# Patient Record
Sex: Female | Born: 1955 | Race: White | Hispanic: No | Smoking: Never smoker
Health system: Southern US, Community
[De-identification: ages and names within clinical notes are randomized; demographics above are authoritative.]

## PROBLEM LIST (undated history)

## (undated) DIAGNOSIS — E039 Hypothyroidism, unspecified: Secondary | ICD-10-CM

## (undated) DIAGNOSIS — I499 Cardiac arrhythmia, unspecified: Secondary | ICD-10-CM

## (undated) DIAGNOSIS — I779 Disorder of arteries and arterioles, unspecified: Secondary | ICD-10-CM

## (undated) DIAGNOSIS — I739 Peripheral vascular disease, unspecified: Secondary | ICD-10-CM

## (undated) DIAGNOSIS — F419 Anxiety disorder, unspecified: Secondary | ICD-10-CM

## (undated) DIAGNOSIS — G629 Polyneuropathy, unspecified: Secondary | ICD-10-CM

## (undated) DIAGNOSIS — C4490 Unspecified malignant neoplasm of skin, unspecified: Secondary | ICD-10-CM

## (undated) DIAGNOSIS — E538 Deficiency of other specified B group vitamins: Secondary | ICD-10-CM

## (undated) DIAGNOSIS — R002 Palpitations: Secondary | ICD-10-CM

## (undated) DIAGNOSIS — M797 Fibromyalgia: Secondary | ICD-10-CM

## (undated) DIAGNOSIS — K5792 Diverticulitis of intestine, part unspecified, without perforation or abscess without bleeding: Secondary | ICD-10-CM

## (undated) DIAGNOSIS — F329 Major depressive disorder, single episode, unspecified: Secondary | ICD-10-CM

## (undated) DIAGNOSIS — I10 Essential (primary) hypertension: Secondary | ICD-10-CM

## (undated) DIAGNOSIS — G43909 Migraine, unspecified, not intractable, without status migrainosus: Secondary | ICD-10-CM

## (undated) DIAGNOSIS — E7800 Pure hypercholesterolemia, unspecified: Secondary | ICD-10-CM

## (undated) DIAGNOSIS — R5383 Other fatigue: Secondary | ICD-10-CM

## (undated) DIAGNOSIS — F32A Depression, unspecified: Secondary | ICD-10-CM

## (undated) DIAGNOSIS — G500 Trigeminal neuralgia: Secondary | ICD-10-CM

## (undated) DIAGNOSIS — M2550 Pain in unspecified joint: Secondary | ICD-10-CM

## (undated) DIAGNOSIS — K589 Irritable bowel syndrome without diarrhea: Secondary | ICD-10-CM

## (undated) HISTORY — PX: OTHER SURGICAL HISTORY: SHX169

## (undated) HISTORY — DX: Anxiety disorder, unspecified: F41.9

## (undated) HISTORY — DX: Disorder of arteries and arterioles, unspecified: I77.9

## (undated) HISTORY — DX: Hypothyroidism, unspecified: E03.9

## (undated) HISTORY — PX: TOTAL ABDOMINAL HYSTERECTOMY W/ BILATERAL SALPINGOOPHORECTOMY: SHX83

## (undated) HISTORY — DX: Migraine, unspecified, not intractable, without status migrainosus: G43.909

## (undated) HISTORY — DX: Essential (primary) hypertension: I10

## (undated) HISTORY — DX: Trigeminal neuralgia: G50.0

## (undated) HISTORY — DX: Pain in unspecified joint: M25.50

## (undated) HISTORY — DX: Unspecified malignant neoplasm of skin, unspecified: C44.90

## (undated) HISTORY — DX: Peripheral vascular disease, unspecified: I73.9

## (undated) HISTORY — PX: TONSILLECTOMY: SUR1361

## (undated) HISTORY — DX: Cardiac arrhythmia, unspecified: I49.9

## (undated) HISTORY — PX: HEAD & NECK SKIN LESION EXCISIONAL BIOPSY: SUR472

## (undated) HISTORY — DX: Pure hypercholesterolemia, unspecified: E78.00

## (undated) HISTORY — DX: Irritable bowel syndrome, unspecified: K58.9

## (undated) HISTORY — DX: Depression, unspecified: F32.A

## (undated) HISTORY — DX: Palpitations: R00.2

## (undated) HISTORY — DX: Deficiency of other specified B group vitamins: E53.8

## (undated) HISTORY — DX: Diverticulitis of intestine, part unspecified, without perforation or abscess without bleeding: K57.92

## (undated) HISTORY — DX: Fibromyalgia: M79.7

## (undated) HISTORY — DX: Major depressive disorder, single episode, unspecified: F32.9

## (undated) HISTORY — DX: Polyneuropathy, unspecified: G62.9

## (undated) HISTORY — DX: Other fatigue: R53.83

## (undated) HISTORY — PX: LAPAROSCOPIC ENDOMETRIOSIS FULGURATION: SUR769

## (undated) HISTORY — PX: CHOLECYSTECTOMY: SHX55

---

## 2014-10-02 DIAGNOSIS — C4491 Basal cell carcinoma of skin, unspecified: Secondary | ICD-10-CM

## 2014-10-02 HISTORY — DX: Basal cell carcinoma of skin, unspecified: C44.91

## 2014-11-16 DIAGNOSIS — D229 Melanocytic nevi, unspecified: Secondary | ICD-10-CM

## 2014-11-16 HISTORY — DX: Melanocytic nevi, unspecified: D22.9

## 2017-11-11 DIAGNOSIS — C4492 Squamous cell carcinoma of skin, unspecified: Secondary | ICD-10-CM

## 2017-11-11 HISTORY — DX: Squamous cell carcinoma of skin, unspecified: C44.92

## 2018-07-26 ENCOUNTER — Encounter: Payer: Self-pay | Admitting: *Deleted

## 2018-07-27 ENCOUNTER — Ambulatory Visit (INDEPENDENT_AMBULATORY_CARE_PROVIDER_SITE_OTHER): Payer: Medicare Other | Admitting: Diagnostic Neuroimaging

## 2018-07-27 ENCOUNTER — Encounter: Payer: Self-pay | Admitting: Diagnostic Neuroimaging

## 2018-07-27 VITALS — BP 145/81 | HR 73 | Ht 65.0 in | Wt 201.0 lb

## 2018-07-27 DIAGNOSIS — R51 Headache: Secondary | ICD-10-CM | POA: Diagnosis not present

## 2018-07-27 DIAGNOSIS — E538 Deficiency of other specified B group vitamins: Secondary | ICD-10-CM | POA: Diagnosis not present

## 2018-07-27 DIAGNOSIS — G43109 Migraine with aura, not intractable, without status migrainosus: Secondary | ICD-10-CM

## 2018-07-27 DIAGNOSIS — G63 Polyneuropathy in diseases classified elsewhere: Secondary | ICD-10-CM

## 2018-07-27 DIAGNOSIS — R519 Headache, unspecified: Secondary | ICD-10-CM

## 2018-07-27 NOTE — Progress Notes (Signed)
[facility]  PATIENT: Stacie Leblanc DOB: 08/20/1955  REFERRING CLINICIAN: Brynda Greathouse HISTORY FROM: patient  REASON FOR VISIT: new consult    HISTORICAL  CHIEF COMPLAINT:  Chief Complaint  Patient presents with  . New Patient (Initial Visit)    Rm 7, alone  . Trigeminal Neuralgia    Has L eyebrow trig pain (4 yrs ago, then spring 2019) now constant L upper gum radiates to nose, eye, forehead burning pain. ? TP or migraine? Has visual flashes 1-2 wk/ 3-4x month L eye lid droops    HISTORY OF PRESENT ILLNESS:   63 year old female here for evaluation of left facial pain.  2015-2016 patient had brief ice pick like sensation of her left forehead.  Episode lasted for just a few seconds.  This happened a few times and spontaneously resolved.  March 2019 patient had some left upper /maxillary molar pain.  She went to the dentist and had a root canal.  Around this time she was having intermittent ice pick sensations over the left fore head region.  Over time she noticed a different kind of burning sensation and pain in her left nasalis region extending superiorly to the left medial orbit, and left supraorbital region.  Eating, brushing her teeth, brushing her skin with her hand would tend to trigger the sensations.  Symptoms are happening multiple times per day on a daily basis now.  Patient also has a history of "complex migraines".  These started when she was 76 or 63 years old.  She describes visual aura, speech arrest, numbness, word finding difficulties, followed by contralateral throbbing severe headaches associated with nausea, photophobia.  She may have 2 or 3 of these per month.  She may have visual auras on a daily basis.  She did well for many years with very rare migraine phenomenon.  In the last couple of years symptoms have worsened.  She has tried some medications for these migraines in the past but is not sure.  Patient also history of neuropathy, possibility  of B12 deficiency.  She is on B12 replacement.  Patient is now on Cymbalta for past 6 months for neuropathy pain as well as her left facial pain.  She is on Cymbalta 30 mg/day which has helped a little bit.  Patient went to eye doctor in the past year who noted left lower facial weakness.  He was concerned about some type of CNS process.  He recommended MRI of the brain but this was not ordered.  Patient has had multiple skin cancer surgeries on her face including her right nose, chin and the left mandibular regions.    REVIEW OF SYSTEMS: Full 14 system review of systems performed and negative with exception of: Chills blurred vision loss of vision feeling hot joint pain aching muscles too much sleep not asleep skin sensitivity ringing in ears palpitations swelling in legs fatigue.  ALLERGIES: No Known Allergies  HOME MEDICATIONS: Outpatient Medications Prior to Visit  Medication Sig Dispense Refill  . aspirin EC 81 MG tablet Take 81 mg by mouth daily.    Marland Kitchen atenolol (TENORMIN) 50 MG tablet Take 25 mg by mouth 2 (two) times daily.     . Cholecalciferol (VITAMIN D) 50 MCG (2000 UT) tablet Take 2,000 Units by mouth daily.    . clonazePAM (KLONOPIN) 2 MG tablet Take 2 mg by mouth 3 (three) times daily.    . cyanocobalamin (,VITAMIN B-12,) 1000 MCG/ML injection Inject 1,000 mcg into the muscle every 30 (thirty)  days.    Marland Kitchen DULoxetine (CYMBALTA) 30 MG capsule Take 30 mg by mouth daily.    . furosemide (LASIX) 20 MG tablet Take 20 mg by mouth as needed.    . hydrochlorothiazide (HYDRODIURIL) 25 MG tablet Take 25 mg by mouth daily.    . hydrocortisone 2.5 % cream Apply topically 3 (three) times daily.    Marland Kitchen levothyroxine (SYNTHROID, LEVOTHROID) 50 MCG tablet Take 50 mcg by mouth daily before breakfast.    . ketoconazole (NIZORAL) 2 % cream Apply 1 application topically daily as needed for irritation.     No facility-administered medications prior to visit.     PAST MEDICAL HISTORY: Past  Medical History:  Diagnosis Date  . Anxiety   . Arthralgia   . Carotid artery disease ([place])   . Depression   . Diverticulitis   . Dysrhythmia   . Fatigue   . Fibromyalgia   . Hypercholesterolemia   . Hypertension   . Hypothyroid   . Irritable bowel   . Migraines   . Neuropathy   . PAD (peripheral artery disease) ([place])   . Palpitations   . Skin cancer    basal and squamous cell, face and back  . Trigeminal neuralgia   . Vitamin B12 deficiency     PAST SURGICAL HISTORY: Past Surgical History:  Procedure Laterality Date  . cesarean secion     x 2 1980, 1981  . CHOLECYSTECTOMY    . HEAD & NECK SKIN LESION EXCISIONAL BIOPSY     nose, intranasal  . LAPAROSCOPIC ENDOMETRIOSIS FULGURATION    . TONSILLECTOMY    . TOTAL ABDOMINAL HYSTERECTOMY W/ BILATERAL SALPINGOOPHORECTOMY      FAMILY HISTORY: Family History  Problem Relation Age of Onset  . Breast cancer Mother   . High Cholesterol Mother   . Cancer Mother   . Breast cancer Maternal Aunt   . Multiple sclerosis Daughter        Sees Dr. Felecia Shelling  . Neuropathy Son     SOCIAL HISTORY: Social History   Socioeconomic History  . Marital status: Divorced    Spouse name: Not on file  . Number of children: Not on file  . Years of education: Not on file  . Highest education level: Not on file  Occupational History  . Not on file  Social Needs  . Financial resource strain: Not on file  . Food insecurity:    Worry: Not on file    Inability: Not on file  . Transportation needs:    Medical: Not on file    Non-medical: Not on file  Tobacco Use  . Smoking status: Not on file  Substance and Sexual Activity  . Alcohol use: Not on file  . Drug use: Not on file  . Sexual activity: Not on file  Lifestyle  . Physical activity:    Days per week: Not on file    Minutes per session: Not on file  . Stress: Not on file  Relationships  . Social connections:    Talks on phone: Not on file    Gets together: Not on file     Attends religious service: Not on file    Active member of club or organization: Not on file    Attends meetings of clubs or organizations: Not on file    Relationship status: Not on file  . Intimate partner violence:    Fear of current or ex partner: Not on file    Emotionally abused: Not on  file    Physically abused: Not on file    Forced sexual activity: Not on file  Other Topics Concern  . Not on file  Social History Narrative   Lives at home with children.  Education:  [employer].  Children 2.  Caffeine - a lot.     PHYSICAL EXAM  GENERAL EXAM/CONSTITUTIONAL: Vitals:  Vitals:   07/27/18 1449  BP: (!) 145/81  Pulse: 73  Weight: 201 lb (91.2 kg)  Height: 5\' 5"  (1.651 m)     Body mass index is 33.45 kg/m. Wt Readings from Last 3 Encounters:  07/27/18 201 lb (91.2 kg)     Patient is in no distress; well developed, nourished and groomed; neck is supple  CARDIOVASCULAR:  Examination of carotid arteries is normal; no carotid bruits  Regular rate and rhythm, no murmurs  Examination of peripheral vascular system by observation and palpation is normal  EYES:  Ophthalmoscopic exam of optic discs and posterior segments is normal; no papilledema or hemorrhages  Visual Acuity Screening   Right eye Left eye Both eyes  Without correction: 20/20 20/20   With correction:        MUSCULOSKELETAL:  Gait, strength, tone, movements noted in Neurologic exam below  NEUROLOGIC: MENTAL STATUS:  No flowsheet data found.  awake, alert, oriented to person, place and time  recent and remote memory intact  normal attention and concentration  language fluent, comprehension intact, naming intact  fund of knowledge appropriate  CRANIAL NERVE:   2nd - no papilledema on fundoscopic exam  2nd, 3rd, 4th, 6th - pupils equal and reactive to light, visual fields full to confrontation, extraocular muscles intact, no nystagmus  5th - facial sensation symmetric  7th - facial  strength --> DECR LEFT NL FOLD; INTERMITTENT LEFT ORBICULARIS OCULI SPASMS  8th - hearing intact  9th - palate elevates symmetrically, uvula midline  11th - shoulder shrug symmetric  12th - tongue protrusion midline  MOTOR:   normal bulk and tone, full strength in the BUE, BLE  SENSORY:   normal and symmetric to light touch, temperature, vibration  COORDINATION:   finger-nose-finger, fine finger movements normal  REFLEXES:   deep tendon reflexes --> BUE 2; BLE 1  GAIT/STATION:   narrow based gait     DIAGNOSTIC DATA (LABS, IMAGING, TESTING) - I reviewed patient records, labs, notes, testing and imaging myself where available.  No results found for: WBC, HGB, HCT, MCV, PLT No results found for: NA, K, CL, CO2, GLUCOSE, BUN, CREATININE, CALCIUM, PROT, ALBUMIN, AST, ALT, ALKPHOS, BILITOT, GFRNONAA, GFRAA No results found for: CHOL, HDL, LDLCALC, LDLDIRECT, TRIG, CHOLHDL No results found for: HGBA1C No results found for: VITAMINB12 No results found for: TSH      ASSESSMENT AND PLAN  63 y.o. year old female here with:  Dx:  1. Left facial pain   2. Vitamin B12 deficiency neuropathy ([place])   3. Complicated migraine     PLAN:  LEFT FACIAL PAIN (? Trigeminal neuralgia) - MRI brain and CN5 (with and without)  - consider increasing duloxetine to 60mg  daily  - may consider gabapentin or carbamazepine in future  LEFT FACIAL WEAKNESS / LEFT EYELID SPASM - check MRI  COMPLICATED MIGRAINE  - may consider topiramate for migraine prevention  NEUROPATHY (B12 DEFICIENCY) - continue B12 replacement  Orders Placed This Encounter  Procedures  . MR BRAIN W WO CONTRAST   Return in about 3 months (around 10/26/2018).    Penni Bombard, MD 07/27/2018, 3:30 PM  Certified in Neurology, Neurophysiology and [facility] [address] [place], [place] [address] [phone]

## 2018-07-28 ENCOUNTER — Telehealth: Payer: Self-pay | Admitting: Diagnostic Neuroimaging

## 2018-07-28 NOTE — Telephone Encounter (Signed)
Medicare order sent to GI. No auth they will reach out to the pt to schedule.  °

## 2018-07-28 NOTE — Telephone Encounter (Signed)
LVM for patient to be aware I left GI phone number of [phone] and to give them a call if she has not heard from them in the next 2-3 business days.

## 2018-08-06 ENCOUNTER — Ambulatory Visit
Admission: RE | Admit: 2018-08-06 | Discharge: 2018-08-06 | Disposition: A | Discharge status: Home / Self Care | Payer: Medicare Other | Source: Ambulatory Visit | Attending: Diagnostic Neuroimaging | Admitting: Diagnostic Neuroimaging

## 2018-08-06 DIAGNOSIS — R51 Headache: Principal | ICD-10-CM

## 2018-08-06 DIAGNOSIS — R519 Headache, unspecified: Secondary | ICD-10-CM

## 2018-08-06 MED ORDER — GADOBENATE DIMEGLUMINE 529 MG/ML IV SOLN
18.0000 mL | Freq: Once | INTRAVENOUS | Status: AC | PRN
  Administered 2018-08-06: 18 mL via INTRAVENOUS

## 2018-08-23 ENCOUNTER — Encounter: Payer: Self-pay | Admitting: Diagnostic Neuroimaging

## 2018-08-23 ENCOUNTER — Telehealth: Payer: Self-pay | Admitting: *Deleted

## 2018-08-23 NOTE — Telephone Encounter (Signed)
LVM informing patient her MRI brain result is unremarkable. Advised Dr Gladstone Lighter plan stated she may consider increasing Cymbalta dose, may consider other medications in future. Advised she call for any questions, concerns before her FU in April.

## 2018-09-15 NOTE — Telephone Encounter (Signed)
Pt's HA's have gotten worse over the past 2 weeks. Please call to advise at [phone]

## 2018-09-15 NOTE — Telephone Encounter (Signed)
LVM requesting call back to discuss further

## 2018-09-17 NOTE — Telephone Encounter (Signed)
Called patient to advise her Dr Leta Baptist has left for the day. I offered to send message to dr on call. She stated she'd rather wait until Monday to get Dr Penumalli's response. She stated it doesn't happen often, so she will go through weekend. She stated "I will be fine".  I advised her I will let her know on Monday. She verbalized understanding, appreciation.

## 2018-09-17 NOTE — Telephone Encounter (Addendum)
Called patient to discuss. She had 2 episodes of excruciating pain in cheek and tooth area.  She stated she's having deeper sharper pain into her cheek that now that feels like a screw driver. It feels like its behind her eye, and her lip gets numb.  She increased cymbalta to 60 mg daily in Jan after office visit. As far as migraines she reported having visual auras almost daily.  She is interested in a migraine preventative medication. She then mentioned that herPCP recommends sleep study for chronic fatigue, having to nap in the afternoon, wakes exhausted some mornings.  I advised her as to sleep study, she can have her PCP send referral to our office to see one of our sleep specialists. As to other complaints, concerns I advised will discuss with Dr Leta Baptist and let her know today. Patient verbalized understanding, appreciation.

## 2018-10-20 ENCOUNTER — Telehealth: Payer: Self-pay | Admitting: *Deleted

## 2018-10-20 NOTE — Telephone Encounter (Signed)
Called patient and informed her that due to current COVID 19 pandemic, our office is severely reducing in person visits in order to minimize the risk to our patients and healthcare providers. We recommend to convert your appointment to a video visit. She stated she will have to see if she can borrow a device to do a video. She will call back later today. She  verbalized understanding, appreciation.

## 2018-10-25 NOTE — Telephone Encounter (Signed)
LVM requesting call back to discuss video vs tele visit.

## 2018-10-25 NOTE — Telephone Encounter (Signed)
Called patient and advised her that due to current COVID 19 pandemic, our office is severely reducing in person visits in order to minimize the risk to our patients and healthcare providers. The phone went dead. I call her back, LVM advising her that we can convert to video or telephone visit. I advised her the appt will be cancelled if she does not return call in 1 hour, so that we can schedule another patient. I informed her that Dr Leta Baptist has openings later this week, so she can be rescheduled.

## 2018-10-25 NOTE — Telephone Encounter (Signed)
Patient called back, stated previous call was dropped. She would like to do video visit but on Wed afternoon. Appt rescheduled. Advised her we'll take all precautions to reduce any security or privacy concerns. This will be treated like an office visit, and we will file with your insurance.  She consented to video visit.  Pt's email is [email or web address]. Pt understands that the [employer] webex software must be downloaded and operational on the device pt plans to use for the visit. Updated EMR. She had no questions,  verbalized understanding, appreciation. Webex scheduled; e mail sent.

## 2018-10-26 ENCOUNTER — Ambulatory Visit: Payer: Medicare Other | Admitting: Diagnostic Neuroimaging

## 2018-10-27 ENCOUNTER — Encounter: Payer: Self-pay | Admitting: Diagnostic Neuroimaging

## 2018-10-27 ENCOUNTER — Other Ambulatory Visit: Payer: Self-pay

## 2018-10-27 ENCOUNTER — Ambulatory Visit (INDEPENDENT_AMBULATORY_CARE_PROVIDER_SITE_OTHER): Payer: Medicare Other | Admitting: Diagnostic Neuroimaging

## 2018-10-27 DIAGNOSIS — G43109 Migraine with aura, not intractable, without status migrainosus: Secondary | ICD-10-CM

## 2018-10-27 DIAGNOSIS — R51 Headache: Secondary | ICD-10-CM | POA: Diagnosis not present

## 2018-10-27 DIAGNOSIS — R519 Headache, unspecified: Secondary | ICD-10-CM

## 2018-10-27 MED ORDER — TOPIRAMATE 50 MG PO TABS: 50.0000 mg | ORAL_TABLET | Freq: Two times a day (BID) | ORAL | 12 refills | Status: AC

## 2018-10-27 NOTE — Progress Notes (Signed)
[facility]  PATIENT: Stacie Leblanc DOB: [birth year]  REFERRING CLINICIAN: [place] HISTORY FROM: patient  REASON FOR VISIT: follow up    HISTORICAL  CHIEF COMPLAINT:  Chief Complaint  Patient presents with  . Facial Pain  . Headache    HISTORY OF PRESENT ILLNESS:   UPDATE (10/27/18, VRP): Since last visit, doing well. Symptoms are stable. Severity is mild. No alleviating or aggravating factors. Tolerating meds.    PRIOR HPI (07/27/18): 63 year old female here for evaluation of left facial pain.  2015-2016 patient had brief ice pick like sensation of her left forehead.  Episode lasted for just a few seconds.  This happened a few times and spontaneously resolved.  March 2019 patient had some left upper /maxillary molar pain.  She went to the dentist and had a root canal.  Around this time she was having intermittent ice pick sensations over the left fore head region.  Over time she noticed a different kind of burning sensation and pain in her left nasalis region extending superiorly to the left medial orbit, and left supraorbital region.  Eating, brushing her teeth, brushing her skin with her hand would tend to trigger the sensations.  Symptoms are happening multiple times per day on a daily basis now.  Patient also has a history of "complex migraines".  These started when she was 37 or 63 years old.  She describes visual aura, speech arrest, numbness, word finding difficulties, followed by contralateral throbbing severe headaches associated with nausea, photophobia.  She may have 2 or 3 of these per month.  She may have visual auras on a daily basis.  She did well for many years with very rare migraine phenomenon.  In the last couple of years symptoms have worsened.  She has tried some medications for these migraines in the past but is not sure.  Patient also history of neuropathy, possibility of B12 deficiency.  She is on B12 replacement.  Patient is now on  Cymbalta for past 6 months for neuropathy pain as well as her left facial pain.  She is on Cymbalta 30 mg/day which has helped a little bit.  Patient went to eye doctor in the past year who noted left lower facial weakness.  He was concerned about some type of CNS process.  He recommended MRI of the brain but this was not ordered.  Patient has had multiple skin cancer surgeries on her face including her right nose, chin and the left mandibular regions.    REVIEW OF SYSTEMS: Full 14 system review of systems performed and negative with exception of: only as per HPI.    ALLERGIES: Allergies  Allergen Reactions  . Codeine Shortness Of Breath    Trouble breathing  . Gadolinium Derivatives     hives  . Alpha-Gal Hives  . Amoxicillin Hives  . Azithromycin Hives  . Ceclor [Cefaclor] Hives  . Influenza Vaccines Hives  . Morphine And Related Hives  . Sulfa Antibiotics Hives  . Multihance [Gadobenate] Hives    Pt monitored by Nursing. Advise steroid prep for further MRI    HOME MEDICATIONS: Outpatient Medications Prior to Visit  Medication Sig Dispense Refill  . aspirin EC 81 MG tablet Take 81 mg by mouth daily.    Marland Kitchen atenolol (TENORMIN) 50 MG tablet Take 25 mg by mouth 2 (two) times daily.     . Cholecalciferol (VITAMIN D) 50 MCG (2000 UT) tablet Take 2,000 Units by mouth daily.    . clonazePAM (KLONOPIN) 2  MG tablet Take 2 mg by mouth 3 (three) times daily.    . cyanocobalamin (,VITAMIN B-12,) 1000 MCG/ML injection Inject 1,000 mcg into the muscle every 30 (thirty) days.    . DULoxetine (CYMBALTA) 30 MG capsule Take 30 mg by mouth daily.    . furosemide (LASIX) 20 MG tablet Take 20 mg by mouth as needed.    . hydrochlorothiazide (HYDRODIURIL) 25 MG tablet Take 25 mg by mouth daily.    . hydrocortisone 2.5 % cream Apply topically 3 (three) times daily.    Marland Kitchen levothyroxine (SYNTHROID, LEVOTHROID) 50 MCG tablet Take 50 mcg by mouth daily before breakfast.    . montelukast (SINGULAIR) 10  MG tablet Take 10 mg by mouth daily.     No facility-administered medications prior to visit.     PAST MEDICAL HISTORY: Past Medical History:  Diagnosis Date  . Anxiety   . Arthralgia   . Carotid artery disease ([place])   . Depression   . Diverticulitis   . Dysrhythmia   . Fatigue   . Fibromyalgia   . Hypercholesterolemia   . Hypertension   . Hypothyroid   . Irritable bowel   . Migraines   . Neuropathy   . PAD (peripheral artery disease) ([place])   . Palpitations   . Skin cancer    basal and squamous cell, face and back  . Trigeminal neuralgia   . Vitamin B12 deficiency     PAST SURGICAL HISTORY: Past Surgical History:  Procedure Laterality Date  . cesarean secion     x 2 1980, 1981  . CHOLECYSTECTOMY    . HEAD & NECK SKIN LESION EXCISIONAL BIOPSY     nose, intranasal  . LAPAROSCOPIC ENDOMETRIOSIS FULGURATION    . TONSILLECTOMY    . TOTAL ABDOMINAL HYSTERECTOMY W/ BILATERAL SALPINGOOPHORECTOMY      FAMILY HISTORY: Family History  Problem Relation Age of Onset  . Breast cancer Mother   . High Cholesterol Mother   . Cancer Mother   . Breast cancer Maternal Aunt   . Multiple sclerosis Daughter        Sees Dr. Felecia Shelling  . Neuropathy Son     SOCIAL HISTORY: Social History   Socioeconomic History  . Marital status: Divorced    Spouse name: Not on file  . Number of children: Not on file  . Years of education: Not on file  . Highest education level: Not on file  Occupational History  . Not on file  Social Needs  . Financial resource strain: Not on file  . Food insecurity:    Worry: Not on file    Inability: Not on file  . Transportation needs:    Medical: Not on file    Non-medical: Not on file  Tobacco Use  . Smoking status: Never Smoker  . Smokeless tobacco: Never Used  Substance and Sexual Activity  . Alcohol use: Not Currently    Frequency: Never  . Drug use: Never  . Sexual activity: Not on file  Lifestyle  . Physical activity:    Days per week:  Not on file    Minutes per session: Not on file  . Stress: Not on file  Relationships  . Social connections:    Talks on phone: Not on file    Gets together: Not on file    Attends religious service: Not on file    Active member of club or organization: Not on file    Attends meetings of clubs or organizations: Not  on file    Relationship status: Not on file  . Intimate partner violence:    Fear of current or ex partner: Not on file    Emotionally abused: Not on file    Physically abused: Not on file    Forced sexual activity: Not on file  Other Topics Concern  . Not on file  Social History Narrative   Lives at home with children.  Education:  [employer].  Children 2.  Caffeine - a lot.     PHYSICAL EXAM  GENERAL EXAM/CONSTITUTIONAL: Vitals:  There were no vitals filed for this visit. There is no height or weight on file to calculate BMI. Wt Readings from Last 3 Encounters:  07/27/18 201 lb (91.2 kg)    Patient is in no distress; well developed, nourished and groomed; neck is supple  NEUROLOGIC: MENTAL STATUS:  No flowsheet data found.  awake, alert, oriented to person, place and time  recent and remote memory intact  normal attention and concentration  language fluent, comprehension intact, naming intact  fund of knowledge appropriate  CRANIAL NERVE:   2nd, 3rd, 4th, 6th - visual fields full to confrontation, extraocular muscles intact, no nystagmus  5th - facial sensation --> SLIGHTLY REDUCED ON LEFT FACE  7th - facial strength --> MILD DECR LEFT NL FOLD  8th - hearing intact  11th - shoulder shrug symmetric  12th - tongue protrusion midline  MOTOR:   NO DRIFT; NO TREMOR  COORDINATION:   fine finger movements normal     DIAGNOSTIC DATA (LABS, IMAGING, TESTING) - I reviewed patient records, labs, notes, testing and imaging myself where available.  No results found for: WBC, HGB, HCT, MCV, PLT No results found for: NA, K, CL, CO2, GLUCOSE,  BUN, CREATININE, CALCIUM, PROT, ALBUMIN, AST, ALT, ALKPHOS, BILITOT, GFRNONAA, GFRAA No results found for: CHOL, HDL, LDLCALC, LDLDIRECT, TRIG, CHOLHDL No results found for: HGBA1C No results found for: VITAMINB12 No results found for: TSH   08/06/18 MRI brain and IAC (with and without) demonstrating: - Few scattered periventricular and subcortical foci of non-specific gliosis. No abnormal lesions are seen on post contrast views. - No acute findings. - Patient had allergic reaction (hives) to IV gadolinium contrast.    ASSESSMENT AND PLAN  63 y.o. year old female here with:  Dx:  1. Left facial pain   2. Complicated migraine     Virtual Visit via Video Note  I connected with AMORIA MCLEES on 10/27/18 at  3:00 PM EDT by a video enabled telemedicine application and verified that I am speaking with the correct person using two identifiers.   I discussed the limitations of evaluation and management by telemedicine and the availability of in person appointments. The patient expressed understanding and agreed to proceed.  I discussed the assessment and treatment plan with the patient. The patient was provided an opportunity to ask questions and all were answered. The patient agreed with the plan and demonstrated an understanding of the instructions.   The patient was advised to call back or seek an in-person evaluation if the symptoms worsen or if the condition fails to improve as anticipated.  I provided 15 minutes of non-face-to-face time during this encounter.   PLAN:  LEFT FACIAL PAIN (? Trigeminal neuralgia) - MONITOR SYMPTOMS FOR NOW - in future consider increasing duloxetine to 60mg  daily  - in future consider gabapentin or carbamazepine in future  COMPLICATED MIGRAINE  - start topiramate for migraine prevention  NEUROPATHY (B12 DEFICIENCY) - continue  B12 replacement  Meds ordered this encounter  Medications  . topiramate (TOPAMAX) 50 MG tablet    Sig: Take 1  tablet (50 mg total) by mouth 2 (two) times daily.    Dispense:  60 tablet    Refill:  12   Return in about 4 months (around 02/26/2019).    Penni Bombard, MD 1/58/0638, 6:85 PM Certified in Neurology, Neurophysiology and Neuroimaging  [facility] [address] [place], [place] [address] [phone]

## 2019-03-18 IMAGING — MR MR HEAD WO/W CM
17 series · 47 of 48 positions shown · IV contrast (20ml multihance)
Comparison: none

[Series 2: T1 · sagittal · 5.0mm · 0.45mm/px · 1 of 24 slices shown (1 of 3)]
[im 1/24]
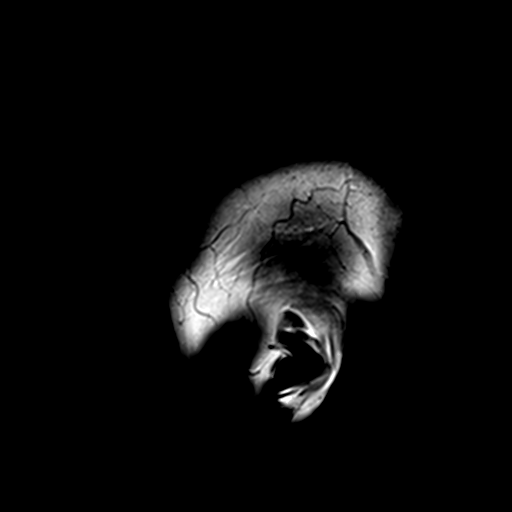

[Series 3: DWI · axial · 3.0mm · 1.80mm/px · z∈[-68,+94]mm · 4 of 110 slices shown (1 of 4)]
[im 1/110]
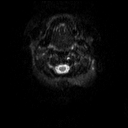
[im 37/110]
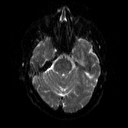
[im 73/110]
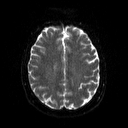
[im 110/110]
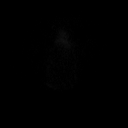

[Series 4: DWI · axial · 3.0mm · 1.80mm/px · z∈[-68,+94]mm · 2 of 53 slices shown (2 of 4)]
[im 1/53]
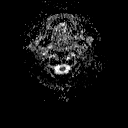
[im 53/53]
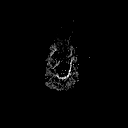

[Series 5: DWI · coronal · 5.0mm · 1.88mm/px · 4 of 85 slices shown (3 of 4)]
[im 1/85]
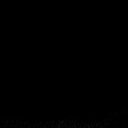
[im 29/85]
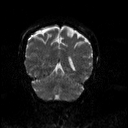
[im 57/85]
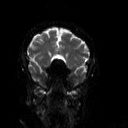
[im 85/85]
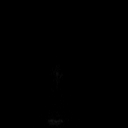

[Series 6: DWI · coronal · 5.0mm · 1.88mm/px · 2 of 43 slices shown (4 of 4)]
[im 1/43]
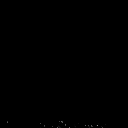
[im 43/43]
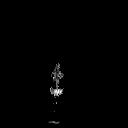

[Series 7: T2 · axial · 5.0mm · 0.54mm/px · 1 of 22 slices shown (1 of 2)]
[im 1/22]
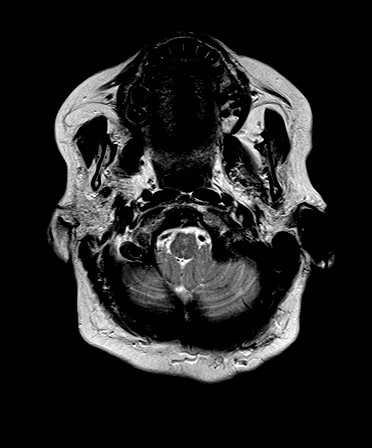

[Series 8: FLAIR · axial · 3.0mm · 0.47mm/px · 1 of 17 slices shown]
[im 1/17]
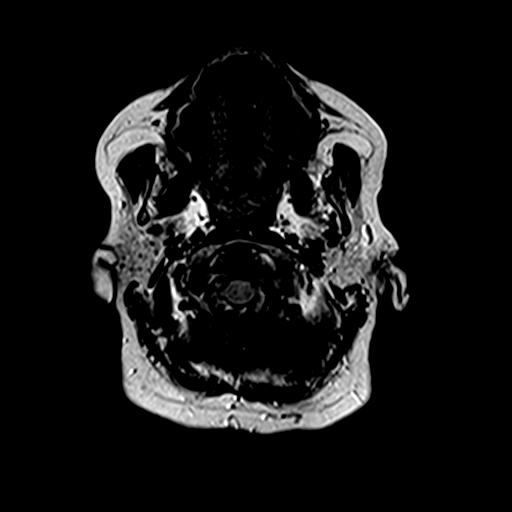

[Series 10: swi_images · axial · 4.0mm · 0.94mm/px · z∈[-60,+96]mm · 2 of 40 slices shown]
[im 1/40]
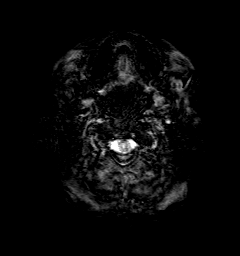
[im 40/40]
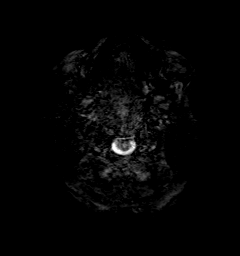

[Series 11: t1_mpr_tra · axial · 1.1mm · 0.75mm/px · z∈[-66,+92]mm · 7 of 144 slices shown (1 of 2)]
[im 1/144]
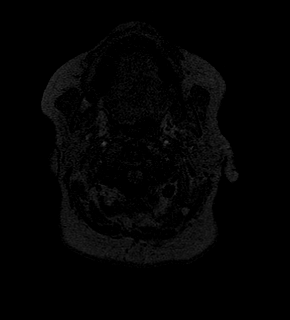
[im 24/144]
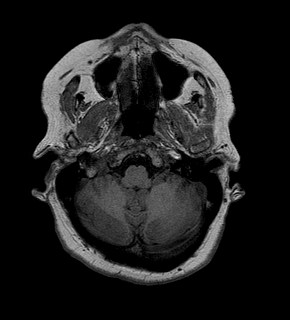
[im 48/144]
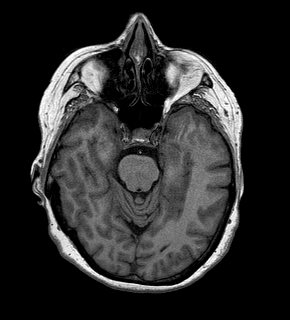
[im 72/144]
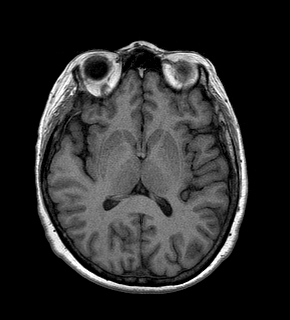
[im 96/144]
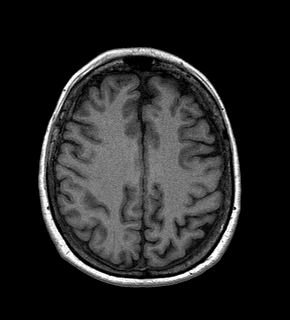
[im 120/144]
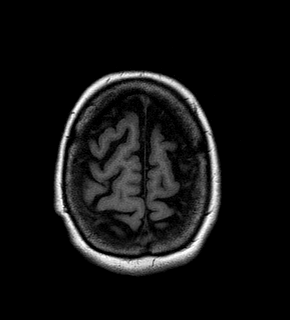
[im 144/144]
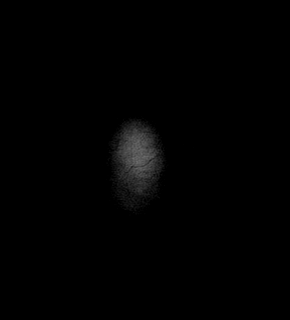

[Series 12: STIR · axial · 3.0mm · 0.62mm/px · z∈[-108,+35]mm · 2 of 37 slices shown]
[im 1/37]
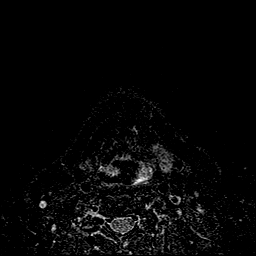
[im 37/37]
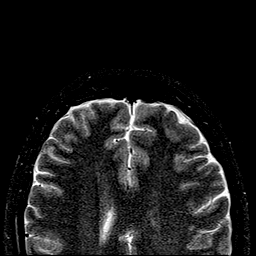

[Series 13: bSSFP · axial · 1.0mm · 0.50mm/px · z∈[-101,+18]mm · 5 of 120 slices shown]
[im 1/120]
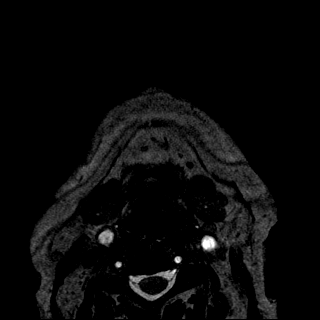
[im 30/120]
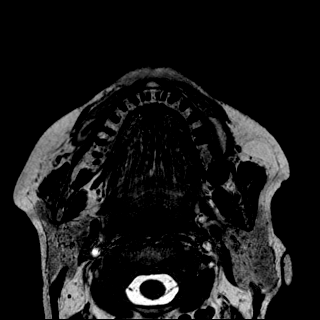
[im 60/120]
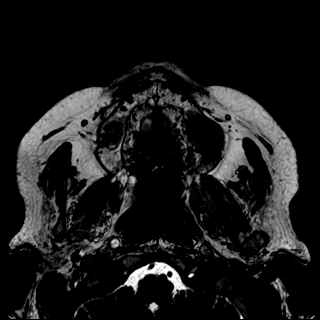
[im 90/120]
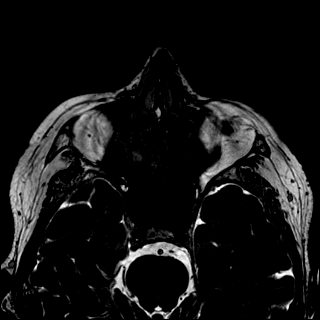
[im 120/120]
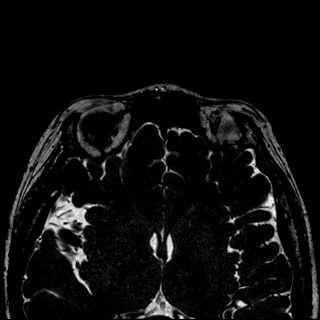

[Series 14: T1 · coronal · 3.0mm · 0.35mm/px · 2 of 43 slices shown (2 of 3)]
[im 1/43]
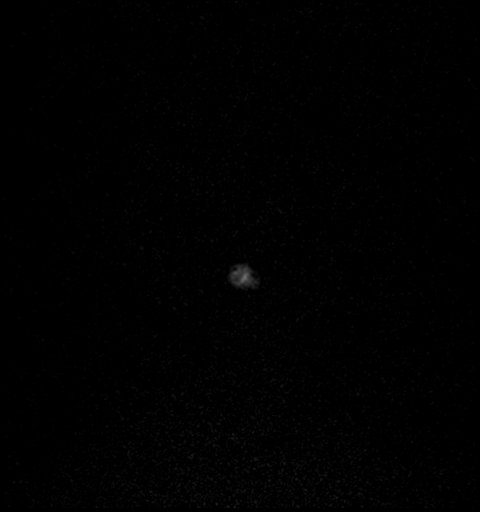
[im 43/43]
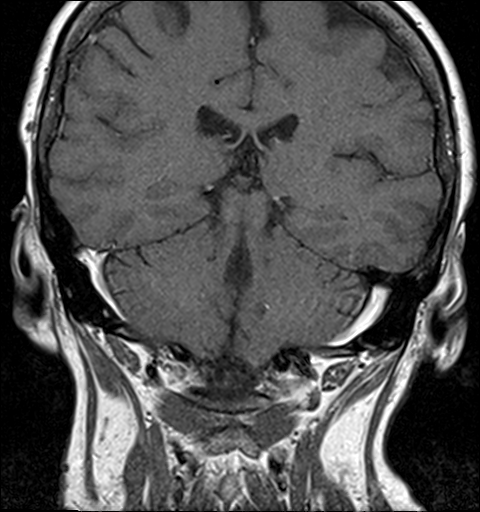

[Series 15: T1 · axial · 3.0mm · 0.50mm/px · z∈[-108,+37]mm · 2 of 45 slices shown (3 of 3)]
[im 1/45]
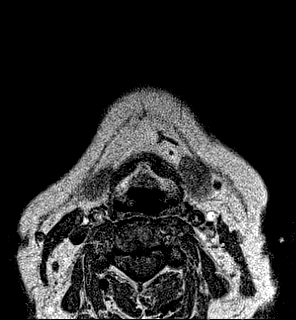
[im 45/45]
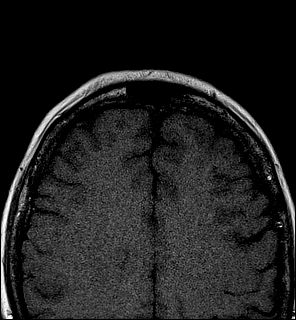

[Series 16: T2 · coronal · 5.0mm · 0.45mm/px · 2 of 34 slices shown (2 of 2)]
[im 1/34]
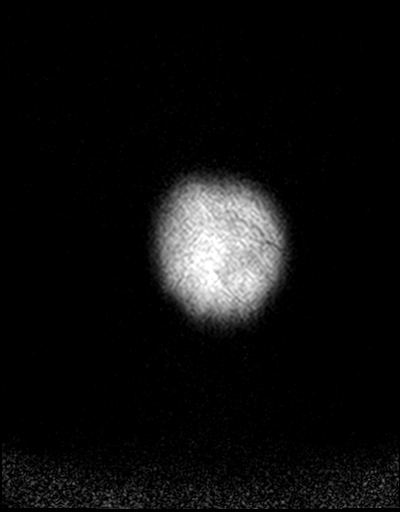
[im 34/34]
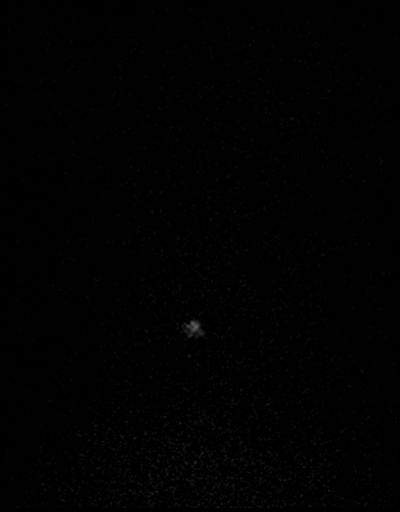

[Series 17: T1 fat-sat · coronal · 3.0mm · 0.35mm/px · 2 of 43 slices shown (1 of 2)]
[im 1/43]
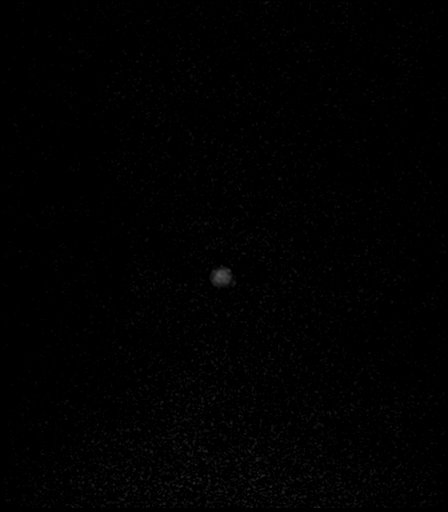
[im 43/43]
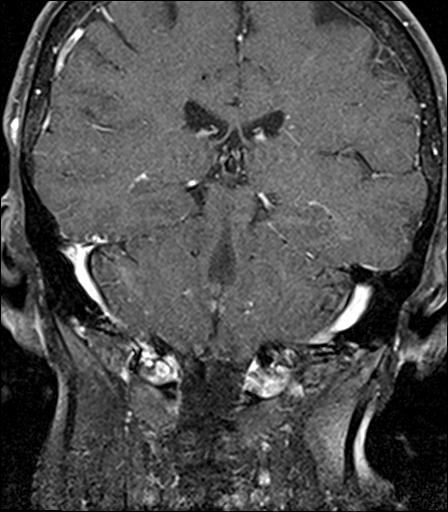

[Series 18: T1 fat-sat · axial · 3.0mm · 0.50mm/px · z∈[-107,+38]mm · 2 of 45 slices shown (2 of 2)]
[im 1/45]
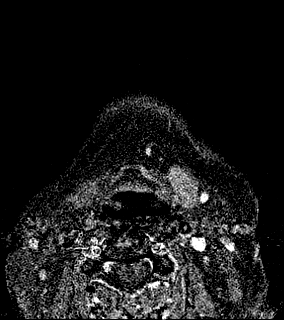
[im 45/45]
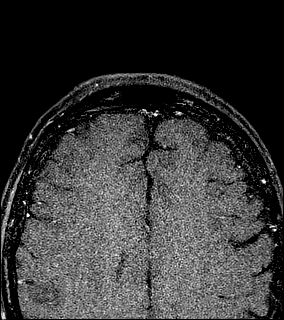

[Series 19: t1_mpr_tra · axial · 1.1mm · 0.75mm/px · z∈[-61,+70]mm · 6 of 144 slices shown (2 of 2)]
[im 1/144]
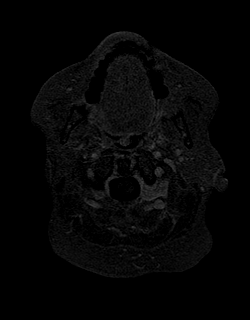
[im 24/144]
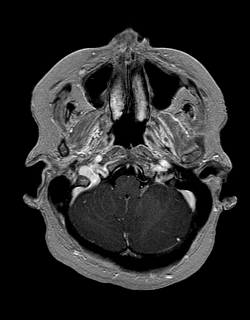
[im 48/144]
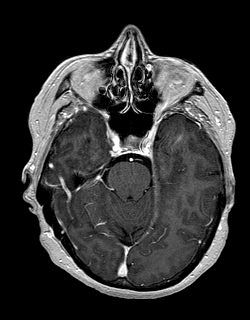
[im 72/144]
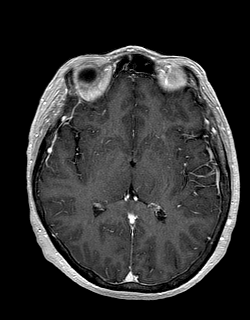
[im 96/144]
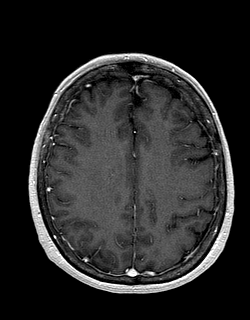
[im 120/144]
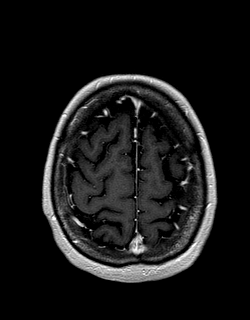

[47 of 48 positions shown; findings below may reference images not displayed]

:
CONTRAST REACTION NOTE

Please note that this exam's formal interpretation is to be provided
by [REDACTED]. I did not review the patient's
images.

The patient experienced hives after injection of 20 cc MultiHance
and I was called to assess her. She was in no distress. Pulse rate
was in the 60s, blood pressure somewhat elevated originally about
175/92, then decreasing to about systolic 160s and diastolic 70s
range. Her breathing was normal with lungs clear to auscultation
bilaterally. No wheezing. No abnormal upper airway sounds. She had
no complaints related to breathing.

She did have a hive and some erythema along the right side of her
neck at original evaluation.

I checked on Ms. Chui King several times over a 40 minutes observation
window. After 40 minutes, the hive had nearly completely resolved
and the surrounding erythema was gone. She had no clinical signs of
worsening or a severe contrast reaction.

Normally in this situation I would have preferred to administer 50
mg of Benadryl, but in this case the patient is from [place]], and did not have someone who could drive her back. Accordingly,
because of the potential for drowsiness with Benadryl and it is
potential effect on driving, I felt that it was safer to not
administer the Benadryl.

I discharged the from the outpatient center with instructions that
in the unlikely event that her symptoms were to recur or were she to
have other more significant symptoms such as shortness of breath,
wheezing, dizziness, or other symptoms, that she should immediately
pull over her vehicle in a safe place and call 911 for assistance,
or if at home, to call for emergency services.

My overall assessment is that she had a mild contrast reaction
including hives and possibly hypertension. Should she need MRI
contrast in the future, premedication would be advised.

## 2019-11-01 ENCOUNTER — Other Ambulatory Visit: Payer: Self-pay | Admitting: Diagnostic Neuroimaging

## 2019-11-01 NOTE — Telephone Encounter (Signed)
Pt is overdue for an appt. I called pt to schedule this appt. No answer, left a message asking her to call me back. If pt calls back please schedule this appt.

## 2019-12-20 ENCOUNTER — Encounter: Payer: Self-pay | Admitting: *Deleted

## 2019-12-22 ENCOUNTER — Ambulatory Visit (INDEPENDENT_AMBULATORY_CARE_PROVIDER_SITE_OTHER): Payer: Medicare Other | Admitting: Physician Assistant

## 2019-12-22 ENCOUNTER — Other Ambulatory Visit: Payer: Self-pay

## 2019-12-22 ENCOUNTER — Encounter: Payer: Self-pay | Admitting: Physician Assistant

## 2019-12-22 DIAGNOSIS — Z87898 Personal history of other specified conditions: Secondary | ICD-10-CM

## 2019-12-22 DIAGNOSIS — Z85828 Personal history of other malignant neoplasm of skin: Secondary | ICD-10-CM | POA: Diagnosis not present

## 2019-12-22 DIAGNOSIS — L719 Rosacea, unspecified: Secondary | ICD-10-CM

## 2019-12-22 DIAGNOSIS — L570 Actinic keratosis: Secondary | ICD-10-CM | POA: Diagnosis not present

## 2019-12-22 DIAGNOSIS — Z86018 Personal history of other benign neoplasm: Secondary | ICD-10-CM

## 2019-12-22 DIAGNOSIS — D485 Neoplasm of uncertain behavior of skin: Secondary | ICD-10-CM

## 2019-12-22 MED ORDER — METRONIDAZOLE 0.75 % EX CREA: TOPICAL_CREAM | Freq: Two times a day (BID) | CUTANEOUS | 0 refills | Status: AC

## 2019-12-22 NOTE — Progress Notes (Signed)
   Follow up Visit  Subjective  Stacie Leblanc is a 64 y.o. female who presents for the following: Skin Problem (Several spots on forehead x 6 months, no bleeding) and Actinic Keratosis (spot on left nose was Ln2 in 05/24/2019 and is still there ). Bump on left forehead that has been there 6 months. It itches but is not sore and hasnt bled. She also has a place on the left bridge of the nose that was frozen last fall by ST but has come back. It isn't sore and hasn't bled. 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 History of BCC on left back and nose. Some were done here and some were done in [place] [facility].   Objective  Well appearing patient in no apparent distress; mood and affect are within normal limits.  A focused examination was performed including face, neck, chest,arms and back. Relevant physical exam findings are noted in the Assessment and Plan. No suspicious moles noted on back.   Objective  Left Nasal Sidewall: Scaling papule     Objective  Left Forehead: Keratotic papule     Objective  Left Forehead, Left Temporal Scalp: Erythematous patches with gritty scale.  Objective  Left Malar Cheek, Right Malar Cheek: Centrifacial erythema with or without papules/pustules.   Assessment & Plan  History of atypical nevus Neck - Posterior  Personal history of squamous cell carcinoma of skin (2) Left Anterior Mandible; Mid Chin  History of basal cell carcinoma (BCC) (2) Left Upper Back; Dorsum of Nose  Neoplasm of uncertain behavior of skin (2) Left Nasal Sidewall  Skin / nail biopsy Type of biopsy: tangential   Informed consent: discussed and consent obtained   Timeout: patient name, date of birth, surgical site, and procedure verified   Anesthesia: the lesion was anesthetized in a standard fashion   Anesthetic:  1% lidocaine w/ epinephrine 1-100,000 local infiltration Instrument used: flexible razor blade   Hemostasis achieved with: ferric subsulfate   Outcome:  patient tolerated procedure well   Post-procedure details: wound care instructions given    Specimen 1 - Surgical pathology Differential Diagnosis: scc vs bcc Check Margins: No  Left Forehead  Skin / nail biopsy Type of biopsy: tangential   Informed consent: discussed and consent obtained   Timeout: patient name, date of birth, surgical site, and procedure verified   Anesthesia: the lesion was anesthetized in a standard fashion   Anesthetic:  1% lidocaine w/ epinephrine 1-100,000 local infiltration Instrument used: flexible razor blade   Hemostasis achieved with: ferric subsulfate   Outcome: patient tolerated procedure well   Post-procedure details: wound care instructions given    Specimen 2 - Surgical pathology Differential Diagnosis: scc vs bcc Check Margins: No  AK (actinic keratosis) (2) Left Forehead; Left Temporal Scalp  Destruction of lesion - Left Forehead, Left Temporal Scalp Complexity: simple   Destruction method: cryotherapy   Informed consent: discussed and consent obtained   Lesion destroyed using liquid nitrogen: Yes   Outcome: patient tolerated procedure well with no complications   Post-procedure details: wound care instructions given    Rosacea (2) Left Malar Cheek; Right Malar Cheek  Ordered Medications: metroNIDAZOLE (METROCREAM) 0.75 % cream

## 2019-12-27 ENCOUNTER — Telehealth: Payer: Self-pay | Admitting: *Deleted

## 2019-12-27 NOTE — Telephone Encounter (Signed)
Pathology to patient: 2 month recheck office visit scheduled.

## 2019-12-27 NOTE — Telephone Encounter (Signed)
-----   Message from Arlyss Gandy, [place] sent at 12/26/2019  5:31 PM EDT ----- Precancer. Recheck with me in 2 months

## 2020-03-05 ENCOUNTER — Ambulatory Visit: Payer: Medicare Other | Admitting: Physician Assistant

## 2020-03-29 ENCOUNTER — Encounter: Payer: Self-pay | Admitting: Physician Assistant

## 2020-03-29 ENCOUNTER — Other Ambulatory Visit: Payer: Self-pay

## 2020-03-29 ENCOUNTER — Ambulatory Visit (INDEPENDENT_AMBULATORY_CARE_PROVIDER_SITE_OTHER): Payer: Medicare Other | Admitting: Physician Assistant

## 2020-03-29 DIAGNOSIS — L219 Seborrheic dermatitis, unspecified: Secondary | ICD-10-CM | POA: Diagnosis not present

## 2020-03-29 DIAGNOSIS — D485 Neoplasm of uncertain behavior of skin: Secondary | ICD-10-CM | POA: Diagnosis not present

## 2020-03-29 DIAGNOSIS — Z85828 Personal history of other malignant neoplasm of skin: Secondary | ICD-10-CM | POA: Diagnosis not present

## 2020-03-29 DIAGNOSIS — Z8589 Personal history of malignant neoplasm of other organs and systems: Secondary | ICD-10-CM

## 2020-03-29 DIAGNOSIS — C4492 Squamous cell carcinoma of skin, unspecified: Secondary | ICD-10-CM

## 2020-03-29 DIAGNOSIS — Z86018 Personal history of other benign neoplasm: Secondary | ICD-10-CM

## 2020-03-29 DIAGNOSIS — L570 Actinic keratosis: Secondary | ICD-10-CM | POA: Diagnosis not present

## 2020-03-29 DIAGNOSIS — Z87898 Personal history of other specified conditions: Secondary | ICD-10-CM

## 2020-03-29 HISTORY — DX: Squamous cell carcinoma of skin, unspecified: C44.92

## 2020-03-29 MED ORDER — HALOBETASOL PROPIONATE 0.05 % EX CREA: TOPICAL_CREAM | Freq: Every day | CUTANEOUS | 3 refills | Status: AC

## 2020-03-29 NOTE — Progress Notes (Signed)
   Follow up Visit  Subjective  Stacie Leblanc is a 64 y.o. female who presents for the following: Skin Problem (Check places on face that we froze at last visit. Check right lower leg near knee. X months but recently has bled. Something for ears. Seb derm. ). Scaling patch right cheek that comes and goes. Both biopsies from forehead and nose were precancer. We also froze some areas last time. She is interested in pdt again for this winter. Last did one in 2017. Lesion near right knee that she hit in the tub and it bled a lot. It has been there for 6 months-1 year. It itches and burns. 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   Objective  Well appearing patient in no apparent distress; mood and affect are within normal limits.  A focused examination was performed including face, ears, legs. Relevant physical exam findings are noted in the Assessment and Plan.   Objective  Left Ear, Right Ear: Erythematous plaques with greasy scale.   Objective  Right Knee - Anterior: Pink scaling papule     Objective  Head - Anterior (Face): Erythematous patches with gritty scale.  Objective  Right Upper Back: Mild  Objective  Left Upper Back: superficial  Objective  Left Upper Back: Well diff  Assessment & Plan  Seborrheic dermatitis (2) Left Ear; Right Ear  halobetasol (ULTRAVATE) 0.05 % cream - Left Ear, Right Ear  Neoplasm of uncertain behavior of skin Right Knee - Anterior  Skin / nail biopsy Type of biopsy: tangential   Informed consent: discussed and consent obtained   Timeout: patient name, date of birth, surgical site, and procedure verified   Procedure prep:  Patient was prepped and draped in usual sterile fashion (Non sterile) Prep type:  Chlorhexidine Anesthesia: the lesion was anesthetized in a standard fashion   Anesthetic:  1% lidocaine w/ epinephrine 1-100,000 local infiltration Instrument used: flexible razor blade   Outcome:  patient tolerated procedure well   Post-procedure details: wound care instructions given    Specimen 1 - Surgical pathology Differential Diagnosis: scc vs bcc Check Margins: No  AK (actinic keratosis) Head - Anterior (Face)  Patient will do Blue light treatment on face with incubation of 1.5 hours.   History of atypical nevus Right Upper Back  History of basal cell carcinoma (BCC) of skin Left Upper Back  History of squamous cell carcinoma Left Upper Back

## 2020-03-29 NOTE — Patient Instructions (Signed)

## 2020-04-04 ENCOUNTER — Telehealth: Payer: Self-pay | Admitting: *Deleted

## 2020-04-04 ENCOUNTER — Encounter: Payer: Self-pay | Admitting: *Deleted

## 2020-04-04 NOTE — Telephone Encounter (Signed)
Path to patient made surgery appointment with Dr.Tafeen.  

## 2020-04-04 NOTE — Telephone Encounter (Signed)
-----   Message from Arlyss Gandy, [place] sent at 04/04/2020  8:24 AM EDT ----- 30 min

## 2020-04-26 ENCOUNTER — Ambulatory Visit: Payer: Medicare Other

## 2020-04-27 ENCOUNTER — Ambulatory Visit: Payer: Medicare Other

## 2020-05-10 ENCOUNTER — Ambulatory Visit (INDEPENDENT_AMBULATORY_CARE_PROVIDER_SITE_OTHER): Payer: Medicare Other | Admitting: Dermatology

## 2020-05-10 ENCOUNTER — Encounter: Payer: Self-pay | Admitting: Dermatology

## 2020-05-10 ENCOUNTER — Other Ambulatory Visit: Payer: Self-pay

## 2020-05-10 DIAGNOSIS — D0471 Carcinoma in situ of skin of right lower limb, including hip: Secondary | ICD-10-CM | POA: Diagnosis not present

## 2020-05-10 DIAGNOSIS — D099 Carcinoma in situ, unspecified: Secondary | ICD-10-CM

## 2020-05-10 MED ORDER — MUPIROCIN 2 % EX OINT: 1.0000 "application " | TOPICAL_OINTMENT | Freq: Every day | CUTANEOUS | 1 refills | Status: AC

## 2020-05-10 NOTE — Patient Instructions (Signed)

## 2020-06-07 ENCOUNTER — Encounter: Payer: Self-pay | Admitting: Dermatology

## 2020-06-07 NOTE — Progress Notes (Signed)
   Follow-Up Visit   Subjective  Stacie Leblanc is a 64 y.o. female who presents for the following: Procedure (Patient here today for treatment CIS x 1 right knee anterior.  ).  CIS Location: Right knee Duration:  Quality:  Associated Signs/Symptoms: Modifying Factors:  Severity:  Timing: Context: For treatment  Objective  Well appearing patient in no apparent distress; mood and affect are within normal limits.  A focused examination was performed including Legs. Relevant physical exam findings are noted in the Assessment and Plan.   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  Destruction of lesion Complexity: simple   Destruction method: electrodesiccation and curettage   Informed consent: discussed and consent obtained   Timeout:  patient name, date of birth, surgical site, and procedure verified Anesthesia: the lesion was anesthetized in a standard fashion   Anesthetic:  1% lidocaine w/ epinephrine 1-100,000 local infiltration Curettage performed in three different directions: Yes     Electrodesiccation performed over the curetted area: No   Curettage cycles:  3 Lesion length (cm):  1.1 Lesion width (cm):  1 Margin per side (cm):  0 Final wound size (cm):  1.1 Hemostasis achieved with:  ferric subsulfate Outcome: patient tolerated procedure well with no complications   Post-procedure details: sterile dressing applied and wound care instructions given   Dressing type: pressure dressing and petrolatum   Additional details:  Wound innoculated with 5 fluorouracil solution.  mupirocin ointment (BACTROBAN) 2 %   Follow up in 3 weeks via phone or MyChart.       I, Lavonna Monarch, MD, have reviewed all documentation for this visit.  The documentation on 06/07/20 for the exam, diagnosis, procedures, and orders are all accurate and complete.

## 2020-06-13 ENCOUNTER — Ambulatory Visit: Payer: Medicare Other

## 2020-07-23 ENCOUNTER — Ambulatory Visit: Payer: Medicare Other

## 2021-11-14 ENCOUNTER — Ambulatory Visit (INDEPENDENT_AMBULATORY_CARE_PROVIDER_SITE_OTHER): Payer: Medicare Other | Admitting: Physician Assistant

## 2021-11-14 DIAGNOSIS — L820 Inflamed seborrheic keratosis: Secondary | ICD-10-CM | POA: Diagnosis not present

## 2021-11-14 DIAGNOSIS — L308 Other specified dermatitis: Secondary | ICD-10-CM

## 2021-11-14 DIAGNOSIS — Z1283 Encounter for screening for malignant neoplasm of skin: Secondary | ICD-10-CM

## 2021-11-14 DIAGNOSIS — L309 Dermatitis, unspecified: Secondary | ICD-10-CM

## 2021-11-14 DIAGNOSIS — D485 Neoplasm of uncertain behavior of skin: Secondary | ICD-10-CM

## 2021-11-14 DIAGNOSIS — L304 Erythema intertrigo: Secondary | ICD-10-CM | POA: Diagnosis not present

## 2021-11-14 DIAGNOSIS — L570 Actinic keratosis: Secondary | ICD-10-CM | POA: Diagnosis not present

## 2021-11-14 MED ORDER — ALCLOMETASONE DIPROPIONATE 0.05 % EX CREA: TOPICAL_CREAM | Freq: Two times a day (BID) | CUTANEOUS | 3 refills | Status: AC | PRN

## 2021-11-14 MED ORDER — KETOCONAZOLE 2 % EX CREA: 1.0000 "application " | TOPICAL_CREAM | Freq: Two times a day (BID) | CUTANEOUS | 10 refills | Status: AC

## 2021-11-14 NOTE — Patient Instructions (Signed)

## 2021-11-19 ENCOUNTER — Encounter: Payer: Self-pay | Admitting: Physician Assistant

## 2021-11-19 NOTE — Progress Notes (Signed)
? ?Follow-Up Visit ?  ?Subjective  ?Stacie Leblanc is a 66 y.o. female who presents for the following: Annual Exam (Patient wants her nose / forehead checked. Some crusty lesions that itch. Also check right knee. Had lesion removed and patient said it was skin cancer. Patient has eczema in her ears that is flared up. Check back. X 2 weeks looks like bite. ). ? ? ?The following portions of the chart were reviewed this encounter and updated as appropriate:  Tobacco  Allergies  Meds  Problems  Med Hx  Surg Hx  Fam Hx   ?  ? ?Objective  ?Well appearing patient in no apparent distress; mood and affect are within normal limits. ? ?A full examination was performed including scalp, head, eyes, ears, nose, lips, neck, chest, axillae, abdomen, back, buttocks, bilateral upper extremities, bilateral lower extremities, hands, feet, fingers, toes, fingernails, and toenails. All findings within normal limits unless otherwise noted below. ? ?No atypical nevi  ? ?Dorsum of Nose ?Hyperkeratotic scale with pink base  ? ? ? ? ? ? ?Right Knee - Anterior ?Hyperkeratotic scale with pink base  ? ? ? ? ? ? ?Left Upper Back, medial ?Hyperkeratotic scale with pink base  ? ? ? ? ? ? ?Left Upper Back,,lateral ?Dark macule ? ? ? ? ? ? ?Left Inframammary Fold, Right Inframammary Fold ?Erythema and slight scale ? ? ?Assessment & Plan  ?Encounter for screening for malignant neoplasm of skin ? ?Head to toe exam ? ?Neoplasm of uncertain behavior of skin (4) ?Dorsum of Nose ? ?Skin / nail biopsy ?Type of biopsy: tangential   ?Informed consent: discussed and consent obtained   ?Timeout: patient name, date of birth, surgical site, and procedure verified   ?Procedure prep:  Patient was prepped and draped in usual sterile fashion (Non sterile) ?Prep type:  Chlorhexidine ?Anesthesia: the lesion was anesthetized in a standard fashion   ?Anesthetic:  1% lidocaine w/ epinephrine 1-100,000 local infiltration ?Instrument used: flexible razor blade    ?Outcome: patient tolerated procedure well   ?Post-procedure details: wound care instructions given   ? ?Specimen 1 - Surgical pathology ?Differential Diagnosis: bcc vs scc ? ?Check Margins: yes ? ?Right Knee - Anterior ? ?Skin / nail biopsy ?Type of biopsy: tangential   ?Informed consent: discussed and consent obtained   ?Timeout: patient name, date of birth, surgical site, and procedure verified   ?Procedure prep:  Patient was prepped and draped in usual sterile fashion (Non sterile) ?Prep type:  Chlorhexidine ?Anesthesia: the lesion was anesthetized in a standard fashion   ?Anesthetic:  1% lidocaine w/ epinephrine 1-100,000 local infiltration ?Instrument used: flexible razor blade   ?Outcome: patient tolerated procedure well   ?Post-procedure details: wound care instructions given   ? ?Specimen 2 - Surgical pathology ?Differential Diagnosis: bcc vs scc ? ?Check Margins: yes ? ?Left Upper Back, medial ? ?Skin / nail biopsy ?Type of biopsy: tangential   ?Informed consent: discussed and consent obtained   ?Timeout: patient name, date of birth, surgical site, and procedure verified   ?Procedure prep:  Patient was prepped and draped in usual sterile fashion (Non sterile) ?Prep type:  Chlorhexidine ?Anesthesia: the lesion was anesthetized in a standard fashion   ?Anesthetic:  1% lidocaine w/ epinephrine 1-100,000 local infiltration ?Instrument used: flexible razor blade   ?Outcome: patient tolerated procedure well   ?Post-procedure details: wound care instructions given   ? ?Specimen 3 - Surgical pathology ?Differential Diagnosis: isk ? ?Check Margins: No ? ?Left Upper Back,,lateral ? ?  Skin / nail biopsy ?Type of biopsy: tangential   ?Informed consent: discussed and consent obtained   ?Timeout: patient name, date of birth, surgical site, and procedure verified   ?Procedure prep:  Patient was prepped and draped in usual sterile fashion (Non sterile) ?Prep type:  Chlorhexidine ?Anesthesia: the lesion was anesthetized in  a standard fashion   ?Anesthetic:  1% lidocaine w/ epinephrine 1-100,000 local infiltration ?Instrument used: flexible razor blade   ?Outcome: patient tolerated procedure well   ?Post-procedure details: wound care instructions given   ? ?Specimen 4 - Surgical pathology ?Differential Diagnosis: bcc vs scc ? ?Check Margins: No ? ?Erythema intertrigo ?Left Inframammary Fold; Right Inframammary Fold ? ?alclomethasone (ACLOVATE) 0.05 % cream - Left Inframammary Fold, Right Inframammary Fold ?Apply topically 2 (two) times daily as needed (Rash). ? ?ketoconazole (NIZORAL) 2 % cream - Left Inframammary Fold, Right Inframammary Fold ?Apply 1 application. topically in the morning and at bedtime. ? ? ? ?I, Milta Croson, PA-C, have reviewed all documentation's for this visit.  The documentation on 11/19/21 for the exam, diagnosis, procedures and orders are all accurate and complete. ?

## 2021-11-20 ENCOUNTER — Telehealth: Payer: Self-pay | Admitting: *Deleted

## 2021-11-20 MED ORDER — TRIAMCINOLONE ACETONIDE 0.1 % EX CREA: 1.0000 "application " | TOPICAL_CREAM | Freq: Two times a day (BID) | CUTANEOUS | 3 refills | Status: AC | PRN

## 2021-11-20 NOTE — Telephone Encounter (Signed)
-----   Message from Warren Danes, [place] sent at 11/19/2021  4:10 PM EDT ----- ?Ak nose- RTC if recurs. Spongiotic dermatitis- rx TAC--qhs to all pink scales.3 rf ?

## 2021-11-20 NOTE — Telephone Encounter (Signed)
Results to patient - triamcinolone cream sent to patient pharmacy per [facility] [place].  ?
